# Patient Record
Sex: Male | Born: 1980 | Race: White | Hispanic: No | Marital: Married | Smoking: Current every day smoker
Health system: Southern US, Community
[De-identification: ages and names within clinical notes are randomized; demographics above are authoritative.]

## PROBLEM LIST (undated history)

## (undated) DIAGNOSIS — L409 Psoriasis, unspecified: Secondary | ICD-10-CM

## (undated) HISTORY — DX: Psoriasis, unspecified: L40.9

---

## 2011-05-05 ENCOUNTER — Emergency Department (HOSPITAL_COMMUNITY)
Admission: EM | Admit: 2011-05-05 | Discharge: 2011-05-05 | Disposition: A | Discharge status: Home / Self Care | Payer: Self-pay | Attending: Emergency Medicine | Admitting: Emergency Medicine

## 2011-05-05 DIAGNOSIS — W540XXA Bitten by dog, initial encounter: Secondary | ICD-10-CM | POA: Insufficient documentation

## 2011-05-05 DIAGNOSIS — S01501A Unspecified open wound of lip, initial encounter: Secondary | ICD-10-CM | POA: Insufficient documentation

## 2012-02-16 ENCOUNTER — Emergency Department (HOSPITAL_COMMUNITY)
Admission: EM | Admit: 2012-02-16 | Discharge: 2012-02-16 | Disposition: A | Discharge status: Home / Self Care | Payer: BC Managed Care – PPO | Attending: Emergency Medicine | Admitting: Emergency Medicine

## 2012-02-16 ENCOUNTER — Encounter (HOSPITAL_COMMUNITY): Payer: Self-pay | Admitting: *Deleted

## 2012-02-16 DIAGNOSIS — S70259A Superficial foreign body, unspecified hip, initial encounter: Secondary | ICD-10-CM | POA: Insufficient documentation

## 2012-02-16 DIAGNOSIS — W268XXA Contact with other sharp object(s), not elsewhere classified, initial encounter: Secondary | ICD-10-CM | POA: Insufficient documentation

## 2012-02-16 DIAGNOSIS — T148XXA Other injury of unspecified body region, initial encounter: Secondary | ICD-10-CM

## 2012-02-16 MED ORDER — SULFAMETHOXAZOLE-TRIMETHOPRIM 800-160 MG PO TABS: 1.0000 | ORAL_TABLET | Freq: Two times a day (BID) | ORAL | Status: AC

## 2012-02-16 NOTE — Discharge Instructions (Signed)
We were able to successfully remove the splinter. It does not appear that there are any residual pieces of the splinter left. Please keep the area clean and dry. Take the antibiotic as prescribed. Return to the ED with high fever, redness around the area, or any other worrisome symptoms.  RESOURCE GUIDE  Dental Problems  Patients with Medicaid: [facility] [phone] [address]                                           [phone] [address] Phone:  [phone]                                                  Phone:  [phone]  If unable to pay or uninsured, contact:  [facility] or [facility]. to become qualified for the adult dental clinic.  Chronic Pain Problems Contact [facility]  [phone] Patients need to be referred by their primary care doctor.  Insufficient Money for Medicine Contact United Way:  call "211" or [facility] [phone].  No Primary Care Doctor Call [facility]  [phone] Other agencies that provide inexpensive medical care    [facility]  [phone]    [facility]  [phone]    [facility]  [phone]    [facility]  [phone]    [facility]  [phone]    [facility]  [phone]  Psychological Services [facility]  [phone] [facility]  [phone] [facility]   [phone] (emergency services [phone])  Substance Abuse Resources [facility]  8022327933 [facility] [phone] [facility] [place] [phone] Floydene Flock [phone] Residential & Outpatient Substance Abuse Program  [phone]  Abuse/Neglect [facility] Child Abuse Hotline [phone] [facility] Child Abuse Hotline 6601396979 (After Hours)  Emergency Shelter [facility] [phone]  Maternity Homes [facility] [phone] [facility] [phone]  MRSA Hotline #:   204 754 1090    [facility] Resources  [facility]     [employer]                          [facility] Dept. [address] [place]                       [address]      [address]  [place]                                                Cristobal Goldmann Phone:  [phone]  Phone:  [phone]                 Phone:  [phone]  [facility] Phone:  [phone]  [facility] Child Abuse Hotline 8671627537 [phone] (After Hours)  Rosalio [place] splinters need to be removed because they can cause skin irritation and infection. If they are close to the surface, splinters can usually be removed easily. Deep splinters may be hard to locate and need treatment by a surgeon. SPLINTER REMOVAL Removal of splinters by your caregiver is considered a surgical procedure.   The area is carefully cleaned. You may require a small amount of anaesthesia (medicine injected near the splinter to numb the tissue and lessen pain). After the splinter is removed, the area will be cleaned again. A bandage is applied.   If your splinter is under a fingernail or toenail, then a small section of the nail may need to be removed. As long as the splinter did not extend to the base of the nail, the nail usually grows back normally.   A splinter that is deeper, more contaminated, or that gets near a structure such as a bone, nerve or blood vessel may need to be removed by a Careers adviser.   You may need special X-rays or scans if the splinter is hard to locate.   Every attempt is made to remove the entire splinter. However, small particles may remain. Tell your caregiver if you feel that a part of the splinter was left behind.  HOME CARE INSTRUCTIONS   Keep the injured area high up (elevated).   Use the  injured area as little as possible.   Keep the injured area clean and dry. Follow any directions from your caregiver.   Keep any follow-up or wound check appointments.  You might need a tetanus shot now if:  You have no idea when you had the last one.   You have never had a tetanus shot before.   The injured area had dirt in it.  Even if you have already removed the splinter, call your caregiver to get a tetanus shot if you need one.  If you need a tetanus shot, and you decide not to get one, there is a rare chance of getting tetanus. Sickness from tetanus can be serious. If you did get a tetanus shot, your arm may swell, get red and warm to the touch at the shot site. This is common and not a problem. SEEK MEDICAL CARE IF:   A splinter has been removed, but you are not better in a day or two.   You develop a temperature.   Signs of infection develop such as:   Redness, swelling or pus around the wound.   Red streaks spreading back from your wound towards your body.  Document Released: 11/03/2004 Document Revised: 09/15/2011 Document Reviewed: 10/06/2008 [facility] Patient Information 2012 [place], [place].

## 2012-02-16 NOTE — ED Provider Notes (Signed)
History     CSN: [id number]  Arrival date & time 02/16/12  4098   First MD Initiated Contact with Patient 02/16/12 2101      Chief Complaint  Patient presents with  . Leg Pain    (Consider location/radiation/quality/duration/timing/severity/associated sxs/prior treatment) HPI History from patient. 31 year old male who presents with splinter. He states that he was wearing shorts while sitting outside at a picnic table, and moved to the side. A large piece of wood became lodged in his posterior right upper leg. He was unable to remove it at home and presents to the ED for treatment. Tetanus is up-to-date. He has not noted any bleeding from the area.  History reviewed. No pertinent past medical history.  History reviewed. No pertinent past surgical history.  History reviewed. No pertinent family history.  History  Substance Use Topics  . Smoking status: Current Everyday Smoker    Types: Cigarettes  . Smokeless tobacco: Not on file  . Alcohol Use: Yes      Review of Systems  Constitutional: Negative.   Musculoskeletal: Negative for myalgias.  Skin: Positive for wound.    Allergies  Review of patient's allergies indicates no known allergies.  Home Medications   Current Outpatient Rx  Name Route Sig Dispense Refill  . IBUPROFEN 200 MG PO TABS Oral Take 200 mg by mouth every 6 (six) hours as needed. Pain      BP 135/94  Pulse 97  Temp(Src) 99.7 F (37.6 C) (Oral)  Resp 18  Ht 5\' 10"  (1.778 m)  Wt 230 lb (104.327 kg)  BMI 33.00 kg/m2  SpO2 100%  Physical Exam  Nursing note and vitals reviewed. Constitutional: He appears well-developed and well-nourished. No distress.  HENT:  Head: Normocephalic and atraumatic.  Neck: Normal range of motion.  Cardiovascular: Normal rate.   Pulmonary/Chest: Effort normal.  Musculoskeletal: Normal range of motion.  Neurological: He is alert.  Skin: Skin is warm and dry. He is not diaphoretic.       Large, approximately 0.5-1  cm in diameter, piece of wood noted to the posterior right upper leg. Approximately 1 cm of the wood is exposed. Foreign body is unable to be removed manually. No bleeding from the area.  Psychiatric: He has a normal mood and affect.    ED Course  FOREIGN BODY REMOVAL Performed by: Grant Fontana Authorized by: Grant Fontana Consent: Verbal consent obtained. Risks and benefits: risks, benefits and alternatives were discussed Consent given by: patient Body area: skin General location: lower extremity Location details: right upper leg Anesthesia: local infiltration Local anesthetic: lidocaine 2% with epinephrine Anesthetic total: 1.5 ml Patient sedated: no Patient cooperative: yes 1 objects recovered. Objects recovered: ~3.5 cm long wood splinter Post-procedure assessment: foreign body removed Patient tolerance: Patient tolerated the procedure well with no immediate complications.   (including critical care time)  Labs Reviewed - No data to display No results found.   1. Splinter in skin       MDM  Patient presents for removal of large splinter. Unable to remove with pickups or hemostats. After injecting the area with lidocaine, I made a small incision around the entry site with an 11 blade to facilitate removal. The splinter was able to be removed in its entirety with no remaining fragments seen. Patient tolerated this well. Minimal bleeding from the area which was easily controlled. Patient instructed on wound care. Return precautions discussed.        Grant Fontana, [place] 02/17/12 2050

## 2012-02-16 NOTE — ED Notes (Signed)
Pt states he sat down on picnic table and "scooted over" pt has small piece of wood protruding from right thigh. Skin otherwise intact. No bleeding noted. Reports last tetanus was in last year.

## 2012-02-19 NOTE — ED Provider Notes (Signed)
Medical screening examination/treatment/procedure(s) were performed by non-physician practitioner and as supervising physician I was immediately available for consultation/collaboration.  Cheri Guppy, MD 02/19/12 [phone]

## 2012-06-18 ENCOUNTER — Encounter: Payer: Self-pay | Admitting: Family Medicine

## 2012-06-18 ENCOUNTER — Ambulatory Visit (INDEPENDENT_AMBULATORY_CARE_PROVIDER_SITE_OTHER): Payer: BC Managed Care – PPO | Admitting: Family Medicine

## 2012-06-18 VITALS — BP 120/82 | HR 81 | Temp 98.5°F | Ht 70.5 in | Wt 251.0 lb

## 2012-06-18 DIAGNOSIS — L408 Other psoriasis: Secondary | ICD-10-CM

## 2012-06-18 DIAGNOSIS — G4700 Insomnia, unspecified: Secondary | ICD-10-CM

## 2012-06-18 DIAGNOSIS — Z720 Tobacco use: Secondary | ICD-10-CM

## 2012-06-18 DIAGNOSIS — L409 Psoriasis, unspecified: Secondary | ICD-10-CM

## 2012-06-18 DIAGNOSIS — G4730 Sleep apnea, unspecified: Secondary | ICD-10-CM

## 2012-06-18 DIAGNOSIS — F17200 Nicotine dependence, unspecified, uncomplicated: Secondary | ICD-10-CM

## 2012-06-18 MED ORDER — VARENICLINE TARTRATE 0.5 MG X 11 & 1 MG X 42 PO MISC: ORAL | Status: AC

## 2012-06-18 NOTE — Patient Instructions (Addendum)
Smoking Cessation This document explains the best ways for you to quit smoking and new treatments to help. It lists new medicines that can double or triple your chances of quitting and quitting for good. It also considers ways to avoid relapses and concerns you may have about quitting, including weight gain. NICOTINE: A POWERFUL ADDICTION If you have tried to quit smoking, you know how hard it can be. It is hard because nicotine is a very addictive drug. For some people, it can be as addictive as heroin or cocaine. Usually, people make 2 or 3 tries, or more, before finally being able to quit. Each time you try to quit, you can learn about what helps and what hurts. Quitting takes hard work and a lot of effort, but you can quit smoking. QUITTING SMOKING IS ONE OF THE MOST IMPORTANT THINGS YOU WILL EVER DO.  You will live longer, feel better, and live better.   The impact on your body of quitting smoking is felt almost immediately:   Within 20 minutes, blood pressure decreases. Pulse returns to its normal level.   After 8 hours, carbon monoxide levels in the blood return to normal. Oxygen level increases.   After 24 hours, chance of heart attack starts to decrease. Breath, hair, and body stop smelling like smoke.   After 48 hours, damaged nerve endings begin to recover. Sense of taste and smell improve.   After 72 hours, the body is virtually free of nicotine. Bronchial tubes relax and breathing becomes easier.   After 2 to 12 weeks, lungs can hold more air. Exercise becomes easier and circulation improves.   Quitting will reduce your risk of having a heart attack, stroke, cancer, or lung disease:   After 1 year, the risk of coronary heart disease is cut in half.   After 5 years, the risk of stroke falls to the same as a nonsmoker.   After 10 years, the risk of lung cancer is cut in half and the risk of other cancers decreases significantly.   After 15 years, the risk of coronary heart  disease drops, usually to the level of a nonsmoker.   If you are pregnant, quitting smoking will improve your chances of having a healthy baby.   The people you live with, especially your children, will be healthier.   You will have extra money to spend on things other than cigarettes.  FIVE KEYS TO QUITTING Studies have shown that these 5 steps will help you quit smoking and quit for good. You have the best chances of quitting if you use them together: 1. Get ready.  2. Get support and encouragement.  3. Learn new skills and behaviors.  4. Get medicine to reduce your nicotine addiction and use it correctly.  5. Be prepared for relapse or difficult situations. Be determined to continue trying to quit, even if you do not succeed at first.  1. GET READY  Set a quit date.   Change your environment.   Get rid of ALL cigarettes, ashtrays, matches, and lighters in your home, car, and place of work.   Do not let people smoke in your home.   Review your past attempts to quit. Think about what worked and what did not.   Once you quit, do not smoke. NOT EVEN A PUFF!  2. GET SUPPORT AND ENCOURAGEMENT Studies have shown that you have a better chance of being successful if you have help. You can get support in many ways.  Tell   your family, friends, and coworkers that you are going to quit and need their support. Ask them not to smoke around you.   Talk to your caregivers (doctor, dentist, nurse, pharmacist, psychologist, and/or smoking counselor).   Get individual, group, or telephone counseling and support. The more counseling you have, the better your chances are of quitting. Programs are available at local hospitals and health centers. Call your local health department for information about programs in your area.   Spiritual beliefs and practices may help some smokers quit.   Quit meters are small computer programs online or downloadable that keep track of quit statistics, such as amount  of "quit-time," cigarettes not smoked, and money saved.   Many smokers find one or more of the many self-help books available useful in helping them quit and stay off tobacco.  3. LEARN NEW SKILLS AND BEHAVIORS  Try to distract yourself from urges to smoke. Talk to someone, go for a walk, or occupy your time with a task.   When you first try to quit, change your routine. Take a different route to work. Drink tea instead of coffee. Eat breakfast in a different place.   Do something to reduce your stress. Take a hot bath, exercise, or read a book.   Plan something enjoyable to do every day. Reward yourself for not smoking.   Explore interactive web-based programs that specialize in helping you quit.  4. GET MEDICINE AND USE IT CORRECTLY Medicines can help you stop smoking and decrease the urge to smoke. Combining medicine with the above behavioral methods and support can quadruple your chances of successfully quitting smoking. The U.S. Food and Drug Administration (FDA) has approved 7 medicines to help you quit smoking. These medicines fall into 3 categories.  Nicotine replacement therapy (delivers nicotine to your body without the negative effects and risks of smoking):   Nicotine gum: Available over-the-counter.   Nicotine lozenges: Available over-the-counter.   Nicotine inhaler: Available by prescription.   Nicotine nasal spray: Available by prescription.   Nicotine skin patches (transdermal): Available by prescription and over-the-counter.   Antidepressant medicine (helps people abstain from smoking, but how this works is unknown):   Bupropion sustained-release (SR) tablets: Available by prescription.   Nicotinic receptor partial agonist (simulates the effect of nicotine in your brain):   Varenicline tartrate tablets: Available by prescription.   Ask your caregiver for advice about which medicines to use and how to use them. Carefully read the information on the package.    Everyone who is trying to quit may benefit from using a medicine. If you are pregnant or trying to become pregnant, nursing an infant, you are under age 18, or you smoke fewer than 10 cigarettes per day, talk to your caregiver before taking any nicotine replacement medicines.   You should stop using a nicotine replacement product and call your caregiver if you experience nausea, dizziness, weakness, vomiting, fast or irregular heartbeat, mouth problems with the lozenge or gum, or redness or swelling of the skin around the patch that does not go away.   Do not use any other product containing nicotine while using a nicotine replacement product.   Talk to your caregiver before using these products if you have diabetes, heart disease, asthma, stomach ulcers, you had a recent heart attack, you have high blood pressure that is not controlled with medicine, a history of irregular heartbeat, or you have been prescribed medicine to help you quit smoking.  5. BE PREPARED FOR RELAPSE OR   DIFFICULT SITUATIONS  Most relapses occur within the first 3 months after quitting. Do not be discouraged if you start smoking again. Remember, most people try several times before they finally quit.   You may have symptoms of withdrawal because your body is used to nicotine. You may crave cigarettes, be irritable, feel very hungry, cough often, get headaches, or have difficulty concentrating.   The withdrawal symptoms are only temporary. They are strongest when you first quit, but they will go away within 10 to 14 days.  Here are some difficult situations to watch for:  Alcohol. Avoid drinking alcohol. Drinking lowers your chances of successfully quitting.   Caffeine. Try to reduce the amount of caffeine you consume. It also lowers your chances of successfully quitting.   Other smokers. Being around smoking can make you want to smoke. Avoid smokers.   Weight gain. Many smokers will gain weight when they quit, usually  less than 10 pounds. Eat a healthy diet and stay active. Do not let weight gain distract you from your main goal, quitting smoking. Some medicines that help you quit smoking may also help delay weight gain. You can always lose the weight gained after you quit.   Bad mood or depression. There are a lot of ways to improve your mood other than smoking.  If you are having problems with any of these situations, talk to your caregiver. SPECIAL SITUATIONS AND CONDITIONS Studies suggest that everyone can quit smoking. Your situation or condition can give you a special reason to quit.  Pregnant women/new mothers: By quitting, you protect your baby's health and your own.   Hospitalized patients: By quitting, you reduce health problems and help healing.   Heart attack patients: By quitting, you reduce your risk of a second heart attack.   Lung, head, and neck cancer patients: By quitting, you reduce your chance of a second cancer.   Parents of children and adolescents: By quitting, you protect your children from illnesses caused by secondhand smoke.  QUESTIONS TO THINK ABOUT Think about the following questions before you try to stop smoking. You may want to talk about your answers with your caregiver.  Why do you want to quit?   If you tried to quit in the past, what helped and what did not?   What will be the most difficult situations for you after you quit? How will you plan to handle them?   Who can help you through the tough times? Your family? Friends? Caregiver?   What pleasures do you get from smoking? What ways can you still get pleasure if you quit?  Here are some questions to ask your caregiver:  How can you help me to be successful at quitting?   What medicine do you think would be best for me and how should I take it?   What should I do if I need more help?   What is smoking withdrawal like? How can I get information on withdrawal?  Quitting takes hard work and a lot of effort,  but you can quit smoking. FOR MORE INFORMATION  Smokefree.gov (http://www.davis-sullivan.com/) provides free, accurate, evidence-based information and professional assistance to help support the immediate and long-term needs of people trying to quit smoking. Document Released: 09/20/2001 Document Revised: 09/15/2011 Document Reviewed: 07/13/2009 [facility] Patient Information 2012 [place], [place].  Insomnia Insomnia is frequent trouble falling and/or staying asleep. Insomnia can be a long term problem or a short term problem. Both are common. Insomnia can be a short term  problem when the wakefulness is related to a certain stress or worry. Long term insomnia is often related to ongoing stress during waking hours and/or poor sleeping habits. Overtime, sleep deprivation itself can make the problem worse. Every little thing feels more severe because you are overtired and your ability to cope is decreased. CAUSES   Stress, anxiety, and depression.   Poor sleeping habits.   Distractions such as TV in the bedroom.   Naps close to bedtime.   Engaging in emotionally charged conversations before bed.   Technical reading before sleep.   Alcohol and other sedatives. They may make the problem worse. They can hurt normal sleep patterns and normal dream activity.   Stimulants such as caffeine for several hours prior to bedtime.   Pain syndromes and shortness of breath can cause insomnia.   Exercise late at night.   Changing time zones may cause sleeping problems (jet lag).  It is sometimes helpful to have someone observe your sleeping patterns. They should look for periods of not breathing during the night (sleep apnea). They should also look to see how long those periods last. If you live alone or observers are uncertain, you can also be observed at a sleep clinic where your sleep patterns will be professionally monitored. Sleep apnea requires a checkup and treatment. Give your caregivers your medical  history. Give your caregivers observations your family has made about your sleep.  SYMPTOMS   Not feeling rested in the morning.   Anxiety and restlessness at bedtime.   Difficulty falling and staying asleep.  TREATMENT   Your caregiver may prescribe treatment for an underlying medical disorders. Your caregiver can give advice or help if you are using alcohol or other drugs for self-medication. Treatment of underlying problems will usually eliminate insomnia problems.   Medications can be prescribed for short time use. They are generally not recommended for lengthy use.   Over-the-counter sleep medicines are not recommended for lengthy use. They can be habit forming.   You can promote easier sleeping by making lifestyle changes such as:   Using relaxation techniques that help with breathing and reduce muscle tension.   Exercising earlier in the day.   Changing your diet and the time of your last meal. No night time snacks.   Establish a regular time to go to bed.   Counseling can help with stressful problems and worry.   Soothing music and white noise may be helpful if there are background noises you cannot remove.   Stop tedious detailed work at least one hour before bedtime.  HOME CARE INSTRUCTIONS   Keep a diary. Inform your caregiver about your progress. This includes any medication side effects. See your caregiver regularly. Take note of:   Times when you are asleep.   Times when you are awake during the night.   The quality of your sleep.   How you feel the next day.  This information will help your caregiver care for you.  Get out of bed if you are still awake after 15 minutes. Read or do some quiet activity. Keep the lights down. Wait until you feel sleepy and go back to bed.   Keep regular sleeping and waking hours. Avoid naps.   Exercise regularly.   Avoid distractions at bedtime. Distractions include watching television or engaging in any intense or  detailed activity like attempting to balance the household checkbook.   Develop a bedtime ritual. Keep a familiar routine of bathing, brushing your teeth, climbing  into bed at the same time each night, listening to soothing music. Routines increase the success of falling to sleep faster.   Use relaxation techniques. This can be using breathing and muscle tension release routines. It can also include visualizing peaceful scenes. You can also help control troubling or intruding thoughts by keeping your mind occupied with boring or repetitive thoughts like the old concept of counting sheep. You can make it more creative like imagining planting one beautiful flower after another in your backyard garden.   During your day, work to eliminate stress. When this is not possible use some of the previous suggestions to help reduce the anxiety that accompanies stressful situations.  MAKE SURE YOU:   Understand these instructions.   Will watch your condition.   Will get help right away if you are not doing well or get worse.  Document Released: 09/23/2000 Document Revised: 09/15/2011 Document Reviewed: 10/24/2007 [facility] Patient Information 2012 [place], [place].

## 2012-06-19 DIAGNOSIS — G4700 Insomnia, unspecified: Secondary | ICD-10-CM | POA: Insufficient documentation

## 2012-06-19 DIAGNOSIS — L409 Psoriasis, unspecified: Secondary | ICD-10-CM | POA: Insufficient documentation

## 2012-06-19 DIAGNOSIS — Z720 Tobacco use: Secondary | ICD-10-CM | POA: Insufficient documentation

## 2012-06-19 NOTE — Assessment & Plan Note (Signed)
See med list Derm if no better

## 2012-06-19 NOTE — Progress Notes (Signed)
  Subjective:    Patient ID: Patrick Dixon, male    DOB: [birth year], 31 y.o.   MRN: [id number]  HPI Pt here to establish --and discuss rash on legs, he would like to quit smoking.   Review of Systems As above    Objective:   Physical Exam  Constitutional: He is oriented to person, place, and time. He appears well-developed and well-nourished.  Cardiovascular: Normal rate and regular rhythm.   Pulmonary/Chest: Effort normal and breath sounds normal.  Neurological: He is alert and oriented to person, place, and time.  Skin:       errythematous , scaley rash on Low ext  C/w with psoriasis  Psychiatric: He has a normal mood and affect. His behavior is normal. Judgment and thought content normal.          Assessment & Plan:

## 2012-06-19 NOTE — Assessment & Plan Note (Signed)
?   Sleep apnea Pt does snore Refer to pulm for sleep eval

## 2012-06-19 NOTE — Assessment & Plan Note (Signed)
chantix F/u 1 month

## 2012-06-26 ENCOUNTER — Encounter: Payer: Self-pay | Admitting: Pulmonary Disease

## 2012-06-26 ENCOUNTER — Ambulatory Visit (INDEPENDENT_AMBULATORY_CARE_PROVIDER_SITE_OTHER): Payer: BC Managed Care – PPO | Admitting: Pulmonary Disease

## 2012-06-26 VITALS — BP 130/82 | HR 100 | Temp 98.6°F | Ht 70.0 in | Wt 252.4 lb

## 2012-06-26 DIAGNOSIS — G4733 Obstructive sleep apnea (adult) (pediatric): Secondary | ICD-10-CM | POA: Insufficient documentation

## 2012-06-26 DIAGNOSIS — Z23 Encounter for immunization: Secondary | ICD-10-CM

## 2012-06-26 NOTE — Patient Instructions (Addendum)
Will set up for home sleep testing.  Will call you once results are available.  Work on weight loss.

## 2012-06-26 NOTE — Assessment & Plan Note (Signed)
The patient's history is very suggestive of clinically significant sleep apnea.  He has been noted to have loud snoring, witnessed apnea during sleep, and has significant nonrestorative sleep.  He is overweight with an abnormal upper airway anatomy.  At this point, I think he needs sleep testing for evaluation, and the patient is agreeable.  The patient has a history suspicious for sleep apnea, has no underlying health issues, and will be a good candidate for home sleep testing.

## 2012-06-26 NOTE — Progress Notes (Signed)
  Subjective:    Patient ID: Patrick Dixon, male    DOB: September 24, 1981, 31 y.o.   MRN: [id number]  HPI The patient is a 31 year old male who had been asked to see for possible obstructive sleep apnea.  He is been noted to have loud snoring, as well as witnessed apneas by bed partners during sleep.  The patient has frequent awakenings at night, and has not rested upon arising.  He feels that his alertness during his waking hours is adequate, but stays very busy in his job.  He does note sleep pressure if he sits down to watch TV or movies.  He denies any sleepiness with driving.  The patient states that his weight is up 15 pounds over the last 2 years, and his Epworth sleepiness score today is 4.  Sleep Questionnaire: What time do you typically go to bed?( Between what hours) 4am-5am .pt bar tends How long does it take you to fall asleep? 20 mins .sometimes longer How many times during the night do you wake up? 5 What time do you get out of bed to start your day? 1200 Do you drive or operate heavy machinery in your occupation? No How much has your weight changed (up or down) over the past two years? (In pounds) 15 lb (6.804 kg) Have you ever had a sleep study before? No Do you currently use CPAP? No Do you wear oxygen at any time? No    Review of Systems  Constitutional: Negative for fever and unexpected weight change.  HENT: Positive for rhinorrhea. Negative for ear pain, nosebleeds, congestion, sore throat, sneezing, trouble swallowing, dental problem, postnasal drip and sinus pressure.   Eyes: Negative for redness and itching.  Respiratory: Positive for choking. Negative for cough, chest tightness, shortness of breath and wheezing.   Cardiovascular: Negative for palpitations and leg swelling.  Gastrointestinal: Negative for nausea and vomiting.  Genitourinary: Negative for dysuria.  Musculoskeletal: Negative for joint swelling.  Skin: Positive for rash.  Neurological: Negative for headaches.    Hematological: Does not bruise/bleed easily.  Psychiatric/Behavioral: Negative for dysphoric mood. The patient is nervous/anxious.        Objective:   Physical Exam Constitutional:  Overweight male, no acute distress  HENT:  Nares patent without discharge, large turbinates noted.   Oropharynx without exudate, palate and uvula are thickened and elongated.   Eyes:  Perrla, eomi, no scleral icterus  Neck:  No JVD, no TMG  Cardiovascular:  Normal rate, regular rhythm, no rubs or gallops.  No murmurs        Intact distal pulses  Pulmonary :  Normal breath sounds, no stridor or respiratory distress   No rales, rhonchi, or wheezing  Abdominal:  Soft, nondistended, bowel sounds present.  No tenderness noted.   Musculoskeletal:  No lower extremity edema noted.  Lymph Nodes:  No cervical lymphadenopathy noted  Skin:  No cyanosis noted  Neurologic:  Appears sleepy, appropriate, moves all 4 extremities without obvious deficit.         Assessment & Plan:

## 2012-08-17 ENCOUNTER — Encounter: Payer: Self-pay | Admitting: Family Medicine

## 2012-08-17 ENCOUNTER — Ambulatory Visit (INDEPENDENT_AMBULATORY_CARE_PROVIDER_SITE_OTHER): Payer: BC Managed Care – PPO | Admitting: Family Medicine

## 2012-08-17 VITALS — BP 120/82 | HR 84 | Temp 98.2°F | Ht 70.5 in | Wt 254.8 lb

## 2012-08-17 DIAGNOSIS — L409 Psoriasis, unspecified: Secondary | ICD-10-CM

## 2012-08-17 DIAGNOSIS — G4733 Obstructive sleep apnea (adult) (pediatric): Secondary | ICD-10-CM

## 2012-08-17 DIAGNOSIS — Z0000 Encounter for general adult medical examination without abnormal findings: Secondary | ICD-10-CM

## 2012-08-17 DIAGNOSIS — Z720 Tobacco use: Secondary | ICD-10-CM

## 2012-08-17 DIAGNOSIS — Z23 Encounter for immunization: Secondary | ICD-10-CM

## 2012-08-17 LAB — CBC WITH DIFFERENTIAL/PLATELET
Basophils Absolute: 0 10*3/uL (ref 0.0–0.1)
Eosinophils Absolute: 0.3 10*3/uL (ref 0.0–0.7)
Eosinophils Relative: 2.6 % (ref 0.0–5.0)
HCT: 45 % (ref 39.0–52.0)
Lymphs Abs: 2.2 10*3/uL (ref 0.7–4.0)
MCHC: 33.7 g/dL (ref 30.0–36.0)
MCV: 92.8 fl (ref 78.0–100.0)
Monocytes Absolute: 0.5 10*3/uL (ref 0.1–1.0)
Neutrophils Relative %: 69.8 % (ref 43.0–77.0)
Platelets: 264 10*3/uL (ref 150.0–400.0)
RDW: 13.8 % (ref 11.5–14.6)
WBC: 10.1 10*3/uL (ref 4.5–10.5)

## 2012-08-17 LAB — LIPID PANEL
Cholesterol: 205 mg/dL — ABNORMAL HIGH (ref 0–200)
HDL: 33 mg/dL — ABNORMAL LOW (ref >39.00)
Total CHOL/HDL Ratio: 6
Triglycerides: 211 mg/dL — ABNORMAL HIGH (ref 0.0–149.0)
VLDL: 42.2 mg/dL — ABNORMAL HIGH (ref 0.0–40.0)

## 2012-08-17 LAB — BASIC METABOLIC PANEL
BUN: 12 mg/dL (ref 6–23)
Chloride: 106 mEq/L (ref 96–112)
Creatinine, Ser: 0.9 mg/dL (ref 0.4–1.5)
Glucose, Bld: 96 mg/dL (ref 70–99)
Potassium: 4.2 mEq/L (ref 3.5–5.1)

## 2012-08-17 LAB — POCT URINALYSIS DIPSTICK
Bilirubin, UA: NEGATIVE
Blood, UA: NEGATIVE
Leukocytes, UA: NEGATIVE
Nitrite, UA: NEGATIVE
Protein, UA: NEGATIVE
pH, UA: 5

## 2012-08-17 LAB — HEPATIC FUNCTION PANEL
ALT: 29 U/L (ref 0–53)
Bilirubin, Direct: 0.1 mg/dL (ref 0.0–0.3)
Total Bilirubin: 0.3 mg/dL (ref 0.3–1.2)

## 2012-08-17 NOTE — Assessment & Plan Note (Signed)
Much better with creams from derm

## 2012-08-17 NOTE — Assessment & Plan Note (Signed)
Per pulm 

## 2012-08-17 NOTE — Assessment & Plan Note (Signed)
Encouraged pt to quit  He never filled chantix rx

## 2012-08-17 NOTE — Patient Instructions (Addendum)
Preventive Care for Adults, Male A healthy lifestyle and preventive care can promote health and wellness. Preventive health guidelines for men include the following key practices:  A routine yearly physical is a good way to check with your caregiver about your health and preventative screening. It is a chance to share any concerns and updates on your health, and to receive a thorough exam.  Visit your dentist for a routine exam and preventative care every 6 months. Brush your teeth twice a day and floss once a day. Good oral hygiene prevents tooth decay and gum disease.  The frequency of eye exams is based on your age, health, family medical history, use of contact lenses, and other factors. Follow your caregiver's recommendations for frequency of eye exams.  Eat a healthy diet. Foods like vegetables, fruits, whole grains, low-fat dairy products, and lean protein foods contain the nutrients you need without too many calories. Decrease your intake of foods high in solid fats, added sugars, and salt. Eat the right amount of calories for you.Get information about a proper diet from your caregiver, if necessary.  Regular physical exercise is one of the most important things you can do for your health. Most adults should get at least 150 minutes of moderate-intensity exercise (any activity that increases your heart rate and causes you to sweat) each week. In addition, most adults need muscle-strengthening exercises on 2 or more days a week.  Maintain a healthy weight. The body mass index (BMI) is a screening tool to identify possible weight problems. It provides an estimate of body fat based on height and weight. Your caregiver can help determine your BMI, and can help you achieve or maintain a healthy weight.For adults 20 years and older:  A BMI below 18.5 is considered underweight.  A BMI of 18.5 to 24.9 is normal.  A BMI of 25 to 29.9 is considered overweight.  A BMI of 30 and above is  considered obese.  Maintain normal blood lipids and cholesterol levels by exercising and minimizing your intake of saturated fat. Eat a balanced diet with plenty of fruit and vegetables. Blood tests for lipids and cholesterol should begin at age 20 and be repeated every 5 years. If your lipid or cholesterol levels are high, you are over 50, or you are a high risk for heart disease, you may need your cholesterol levels checked more frequently.Ongoing high lipid and cholesterol levels should be treated with medicines if diet and exercise are not effective.  If you smoke, find out from your caregiver how to quit. If you do not use tobacco, do not start.  If you choose to drink alcohol, do not exceed 2 drinks per day. One drink is considered to be 12 ounces (355 mL) of beer, 5 ounces (148 mL) of wine, or 1.5 ounces (44 mL) of liquor.  Avoid use of street drugs. Do not share needles with anyone. Ask for help if you need support or instructions about stopping the use of drugs.  High blood pressure causes heart disease and increases the risk of stroke. Your blood pressure should be checked at least every 1 to 2 years. Ongoing high blood pressure should be treated with medicines, if weight loss and exercise are not effective.  If you are 45 to 32 years old, ask your caregiver if you should take aspirin to prevent heart disease.  Diabetes screening involves taking a blood sample to check your fasting blood sugar level. This should be done once every 3 years,   after age 45, if you are within normal weight and without risk factors for diabetes. Testing should be considered at a younger age or be carried out more frequently if you are overweight and have at least 1 risk factor for diabetes.  Colorectal cancer can be detected and often prevented. Most routine colorectal cancer screening begins at the age of 50 and continues through age 75. However, your caregiver may recommend screening at an earlier age if you  have risk factors for colon cancer. On a yearly basis, your caregiver may provide home test kits to check for hidden blood in the stool. Use of a small camera at the end of a tube, to directly examine the colon (sigmoidoscopy or colonoscopy), can detect the earliest forms of colorectal cancer. Talk to your caregiver about this at age 50, when routine screening begins. Direct examination of the colon should be repeated every 5 to 10 years through age 75, unless early forms of pre-cancerous polyps or small growths are found.  Hepatitis C blood testing is recommended for all people born from 1945 through 1965 and any individual with known risks for hepatitis C.  Practice safe sex. Use condoms and avoid high-risk sexual practices to reduce the spread of sexually transmitted infections (STIs). STIs include gonorrhea, chlamydia, syphilis, trichomonas, herpes, HPV, and human immunodeficiency virus (HIV). Herpes, HIV, and HPV are viral illnesses that have no cure. They can result in disability, cancer, and death.  A one-time screening for abdominal aortic aneurysm (AAA) and surgical repair of large AAAs by sound wave imaging (ultrasonography) is recommended for ages 65 to 75 years who are current or former smokers.  Healthy men should no longer receive prostate-specific antigen (PSA) blood tests as part of routine cancer screening. Consult with your caregiver about prostate cancer screening.  Testicular cancer screening is not recommended for adult males who have no symptoms. Screening includes self-exam, caregiver exam, and other screening tests. Consult with your caregiver about any symptoms you have or any concerns you have about testicular cancer.  Use sunscreen with skin protection factor (SPF) of 30 or more. Apply sunscreen liberally and repeatedly throughout the day. You should seek shade when your shadow is shorter than you. Protect yourself by wearing long sleeves, pants, a wide-brimmed hat, and  sunglasses year round, whenever you are outdoors.  Once a month, do a whole body skin exam, using a mirror to look at the skin on your back. Notify your caregiver of new moles, moles that have irregular borders, moles that are larger than a pencil eraser, or moles that have changed in shape or color.  Stay current with required immunizations.  Influenza. You need a dose every fall (or winter). The composition of the flu vaccine changes each year, so being vaccinated once is not enough.  Pneumococcal polysaccharide. You need 1 to 2 doses if you smoke cigarettes or if you have certain chronic medical conditions. You need 1 dose at age 65 (or older) if you have never been vaccinated.  Tetanus, diphtheria, pertussis (Tdap, Td). Get 1 dose of Tdap vaccine if you are younger than age 65 years, are over 65 and have contact with an infant, are a healthcare worker, or simply want to be protected from whooping cough. After that, you need a Td booster dose every 10 years. Consult your caregiver if you have not had at least 3 tetanus and diphtheria-containing shots sometime in your life or have a deep or dirty wound.  HPV. This vaccine is recommended   for males 13 through 32 years of age. This vaccine may be given to men 22 through 32 years of age who have not completed the 3 dose series. It is recommended for men through age 26 who have sex with men or whose immune system is weakened because of HIV infection, other illness, or medications. The vaccine is given in 3 doses over 6 months.  Measles, mumps, rubella (MMR). You need at least 1 dose of MMR if you were born in 1957 or later. You may also need a 2nd dose.  Meningococcal. If you are age 19 to 21 years and a first-year college student living in a residence hall, or have one of several medical conditions, you need to get vaccinated against meningococcal disease. You may also need additional booster doses.  Zoster (shingles). If you are age 60 years or  older, you should get this vaccine.  Varicella (chickenpox). If you have never had chickenpox or you were vaccinated but received only 1 dose, talk to your caregiver to find out if you need this vaccine.  Hepatitis A. You need this vaccine if you have a specific risk factor for hepatitis A virus infection, or you simply wish to be protected from this disease. The vaccine is usually given as 2 doses, 6 to 18 months apart.  Hepatitis B. You need this vaccine if you have a specific risk factor for hepatitis B virus infection or you simply wish to be protected from this disease. The vaccine is given in 3 doses, usually over 6 months. Preventative Service / Frequency Ages 19 to 39  Blood pressure check.** / Every 1 to 2 years.  Lipid and cholesterol check.** / Every 5 years beginning at age 20.  Hepatitis C blood test.** / For any individual with known risks for hepatitis C.  Skin self-exam. / Monthly.  Influenza immunization.** / Every year.  Pneumococcal polysaccharide immunization.** / 1 to 2 doses if you smoke cigarettes or if you have certain chronic medical conditions.  Tetanus, diphtheria, pertussis (Tdap,Td) immunization. / A one-time dose of Tdap vaccine. After that, you need a Td booster dose every 10 years.  HPV immunization. / 3 doses over 6 months, if 26 and younger.  Measles, mumps, rubella (MMR) immunization. / You need at least 1 dose of MMR if you were born in 1957 or later. You may also need a 2nd dose.  Meningococcal immunization. / 1 dose if you are age 19 to 21 years and a first-year college student living in a residence hall, or have one of several medical conditions, you need to get vaccinated against meningococcal disease. You may also need additional booster doses.  Varicella immunization.** / Consult your caregiver.  Hepatitis A immunization.** / Consult your caregiver. 2 doses, 6 to 18 months apart.  Hepatitis B immunization.** / Consult your caregiver. 3 doses  usually over 6 months. Ages 40 to 64  Blood pressure check.** / Every 1 to 2 years.  Lipid and cholesterol check.** / Every 5 years beginning at age 20.  Fecal occult blood test (FOBT) of stool. / Every year beginning at age 50 and continuing until age 75. You may not have to do this test if you get colonoscopy every 10 years.  Flexible sigmoidoscopy** or colonoscopy.** / Every 5 years for a flexible sigmoidoscopy or every 10 years for a colonoscopy beginning at age 50 and continuing until age 75.  Hepatitis C blood test.** / For all people born from 1945 through 1965 and any   individual with known risks for hepatitis C.  Skin self-exam. / Monthly.  Influenza immunization.** / Every year.  Pneumococcal polysaccharide immunization.** / 1 to 2 doses if you smoke cigarettes or if you have certain chronic medical conditions.  Tetanus, diphtheria, pertussis (Tdap/Td) immunization.** / A one-time dose of Tdap vaccine. After that, you need a Td booster dose every 10 years.  Measles, mumps, rubella (MMR) immunization. / You need at least 1 dose of MMR if you were born in 1957 or later. You may also need a 2nd dose.  Varicella immunization.**/ Consult your caregiver.  Meningococcal immunization.** / Consult your caregiver.  Hepatitis A immunization.** / Consult your caregiver. 2 doses, 6 to 18 months apart.  Hepatitis B immunization.** / Consult your caregiver. 3 doses, usually over 6 months. Ages 65 and over  Blood pressure check.** / Every 1 to 2 years.  Lipid and cholesterol check.**/ Every 5 years beginning at age 20.  Fecal occult blood test (FOBT) of stool. / Every year beginning at age 50 and continuing until age 75. You may not have to do this test if you get colonoscopy every 10 years.  Flexible sigmoidoscopy** or colonoscopy.** / Every 5 years for a flexible sigmoidoscopy or every 10 years for a colonoscopy beginning at age 50 and continuing until age 75.  Hepatitis C blood  test.** / For all people born from 1945 through 1965 and any individual with known risks for hepatitis C.  Abdominal aortic aneurysm (AAA) screening.** / A one-time screening for ages 65 to 75 years who are current or former smokers.  Skin self-exam. / Monthly.  Influenza immunization.** / Every year.  Pneumococcal polysaccharide immunization.** / 1 dose at age 65 (or older) if you have never been vaccinated.  Tetanus, diphtheria, pertussis (Tdap, Td) immunization. / A one-time dose of Tdap vaccine if you are over 65 and have contact with an infant, are a healthcare worker, or simply want to be protected from whooping cough. After that, you need a Td booster dose every 10 years.  Varicella immunization. ** / Consult your caregiver.  Meningococcal immunization.** / Consult your caregiver.  Hepatitis A immunization. ** / Consult your caregiver. 2 doses, 6 to 18 months apart.  Hepatitis B immunization.** / Check with your caregiver. 3 doses, usually over 6 months. **Family history and personal history of risk and conditions may change your caregiver's recommendations. Document Released: 11/22/2001 Document Revised: 12/19/2011 Document Reviewed: 02/21/2011 [employer] Patient Information 2013 [employer].  

## 2012-08-17 NOTE — Progress Notes (Signed)
Subjective:    Patient ID: Patrick Dixon, male    DOB: Jun 20, 1981, 31 y.o.   MRN: [id number]  HPI Pt here for cpe and labs. Pt saw derm and psoriasis is much better.   Pt also saw pulmonary and home sleep study was done--pt waiting to hear from them. No new complaints.     Review of Systems Review of Systems  Constitutional: Negative for activity change, appetite change and fatigue.  HENT: Negative for hearing loss, congestion, tinnitus and ear discharge.  dentist--due Eyes: Negative for visual disturbance (see optho-due)  Respiratory: Negative for cough, chest tightness and shortness of breath.   Cardiovascular: Negative for chest pain, palpitations and leg swelling.  Gastrointestinal: Negative for abdominal pain, diarrhea, constipation and abdominal distention.  Genitourinary: Negative for urgency, frequency, decreased urine volume and difficulty urinating.  Musculoskeletal: Negative for back pain, arthralgias and gait problem.  Skin: Negative for color change, pallor and rash.  Neurological: Negative for dizziness, light-headedness, numbness and headaches.  Hematological: Negative for adenopathy. Does not bruise/bleed easily.  Psychiatric/Behavioral: Negative for suicidal ideas, confusion, sleep disturbance, self-injury, dysphoric mood, decreased concentration and agitation.   Past Medical History  Diagnosis Date  . Psoriasis    History   Social History  . Marital Status: Married    Spouse Name: N/A    Number of Children: N/A  . Years of Education: N/A   Occupational History  . [employer]     Social History Main Topics  . Smoking status: Current Every Day Smoker -- 1.5 packs/day for 4 years    Types: Cigarettes  . Smokeless tobacco: Never Used  . Alcohol Use: 6.0 oz/week    10 Cans of beer per week  . Drug Use: No  . Sexually Active: Not on file   Other Topics Concern  . Not on file   Social History Narrative  . No narrative on file   Family  History  Problem Relation Age of Onset  . Diabetes Father   . Heart attack Maternal Grandfather   . Heart disease Maternal Grandfather 33    MI   Current Outpatient Prescriptions on File Prior to Visit  Medication Sig Dispense Refill  . varenicline (CHANTIX STARTING MONTH PAK) 0.5 MG X 11 & 1 MG X 42 tablet Take one 0.5 mg tablet by mouth once daily for 3 days, then increase to one 0.5 mg tablet twice daily for 4 days, then increase to one 1 mg tablet twice daily.  53 tablet  0         Objective:   Physical Exam  BP 120/82  Pulse 84  Temp 98.2 F (36.8 C) (Oral)  Ht 5' 10.5" (1.791 m)  Wt 254 lb 12.8 oz (115.577 kg)  BMI 36.04 kg/m2  SpO2 98% General appearance: alert, cooperative, appears stated age and no distress Head: Normocephalic, without obvious abnormality, atraumatic Eyes: conjunctivae/corneas clear. PERRL, EOM's intact. Fundi benign. Ears: normal TM's and external ear canals both ears Nose: Nares normal. Septum midline. Mucosa normal. No drainage or sinus tenderness. Throat: lips, mucosa, and tongue normal; teeth and gums normal Neck: no adenopathy, no carotid bruit, no JVD, supple, symmetrical, trachea midline and thyroid not enlarged, symmetric, no tenderness/mass/nodules Back: symmetric, no curvature. ROM normal. No CVA tenderness. Lungs: clear to auscultation bilaterally Chest wall: no tenderness Heart: regular rate and rhythm, S1, S2 normal, no murmur, click, rub or gallop Abdomen: soft, non-tender; bowel sounds normal; no masses,  no organomegaly Male genitalia: normal, penis:  no lesions or discharge. testes: no masses or tenderness. no hernias Rectal: deferred Extremities: extremities normal, atraumatic, no cyanosis or edema Pulses: 2+ and symmetric Skin: Skin color, texture, turgor normal. No rashes or lesions Lymph nodes: Cervical, supraclavicular, and axillary nodes normal. Neurologic: Alert and oriented X 3, normal strength and tone. Normal symmetric  reflexes. Normal coordination and gait Psych--- no anxiety or depression      Assessment & Plan:

## 2012-08-22 ENCOUNTER — Telehealth: Payer: Self-pay | Admitting: Pulmonary Disease

## 2012-08-22 DIAGNOSIS — G4733 Obstructive sleep apnea (adult) (pediatric): Secondary | ICD-10-CM

## 2012-08-22 NOTE — Telephone Encounter (Signed)
KC, please advise what type of sleep study you would like to order on patient. Thanks.

## 2012-08-23 NOTE — Telephone Encounter (Signed)
[facility] scheduled with Patrick Dixon for Monday 09/17/12 pt will arrive to sleep lab after he gets off work at 4 am. Jeanene Erb and spoke with patient and he is aware of appointment. Paperwork has been mailed to him. Spoke with Camelia Eng and she placed notes in computer, so Patrick Dixon will know what time patient is due to come in to the lab for study. Rhonda J Cobb

## 2012-08-23 NOTE — Telephone Encounter (Signed)
Order has been sent to [facility].  

## 2012-08-23 NOTE — Telephone Encounter (Signed)
Can send order for sle3

## 2012-09-17 ENCOUNTER — Ambulatory Visit (HOSPITAL_BASED_OUTPATIENT_CLINIC_OR_DEPARTMENT_OTHER): Payer: BC Managed Care – PPO | Attending: Pulmonary Disease

## 2012-09-17 DIAGNOSIS — G4733 Obstructive sleep apnea (adult) (pediatric): Secondary | ICD-10-CM | POA: Insufficient documentation

## 2012-09-26 ENCOUNTER — Telehealth: Payer: Self-pay | Admitting: Pulmonary Disease

## 2012-09-26 DIAGNOSIS — G4730 Sleep apnea, unspecified: Secondary | ICD-10-CM

## 2012-09-26 DIAGNOSIS — G4710 Hypersomnia, unspecified: Secondary | ICD-10-CM

## 2012-09-26 NOTE — Procedures (Signed)
NAME:  Patrick Dixon, Patrick Dixon            ACCOUNT NO.:  [id number]  MEDICAL RECORD NO.:  [id number]          PATIENT TYPE:  OUT  LOCATION:  SLEEP CENTER                 FACILITY:  [facility]  PHYSICIAN:  Barbaraann Share, MD,FCCPDATE OF BIRTH:  1980-02-19  DATE OF STUDY:  09/17/2012                           NOCTURNAL POLYSOMNOGRAM  REFERRING PHYSICIAN:  Barbaraann Share, MD,FCCP  REFERRING PHYSICIAN:  Barbaraann Share, MD,FCCP  INDICATION FOR STUDY:  Hypersomnia with sleep apnea.  EPWORTH SLEEPINESS SCORE:  5.  SLEEP ARCHITECTURE:  The patient had a total sleep time of 289 minutes with decreased slow-wave sleep for age and only 24 minutes of REM. Sleep onset latency was normal at 13 minutes, and REM onset was delayed at 172 minutes.  Sleep efficiency was moderately reduced at 83%.  RESPIRATORY DATA:  The patient was found to have 6 obstructive apneas and 92 obstructive hypopneas, giving him an AHI of 20 events per hour. The events occurred in all body positions, and there was very loud snoring noted throughout.  OXYGEN DATA:  There was O2 desaturation as low as 86% with the patient's obstructive events.  CARDIAC DATA:  No clinically significant arrhythmias were noted.  MOVEMENT/PARASOMNIA:  The patient had no significant leg jerks or other abnormal behavior seen.  IMPRESSION/RECOMMENDATION:  Moderate obstructive sleep apnea/hypopnea syndrome, with an AHI of 20 events per hour and oxygen desaturation as low as 86%.  Treatment for this degree of sleep apnea can include a trial of weight loss alone, upper airway surgery, dental appliance, and also CPAP.  Clinical correlation is suggested.     Barbaraann Share, MD,FCCP Diplomate, American Board of Sleep Medicine    [facility]MED[facility]  D:  09/26/2012 08:32:11  T:  09/26/2012 21:09:37  Job:  [id number]

## 2012-09-26 NOTE — Telephone Encounter (Signed)
Needs OV with KC to discuss sleep results. 

## 2012-09-26 NOTE — Telephone Encounter (Signed)
LMTCB x 1 

## 2012-10-01 NOTE — Telephone Encounter (Signed)
Spoke with pt and have scheduled rov with KC for tomorrow am at 11:30

## 2012-10-01 NOTE — Telephone Encounter (Signed)
Pt returned call. Patrick Dixon °

## 2012-10-02 ENCOUNTER — Ambulatory Visit (INDEPENDENT_AMBULATORY_CARE_PROVIDER_SITE_OTHER): Payer: BC Managed Care – PPO | Admitting: Pulmonary Disease

## 2012-10-02 ENCOUNTER — Encounter: Payer: Self-pay | Admitting: Pulmonary Disease

## 2012-10-02 VITALS — BP 140/100 | HR 97 | Temp 98.0°F | Ht 70.0 in | Wt 257.0 lb

## 2012-10-02 DIAGNOSIS — G4733 Obstructive sleep apnea (adult) (pediatric): Secondary | ICD-10-CM

## 2012-10-02 NOTE — Patient Instructions (Addendum)
Work on weight loss.  Let me know if not successful.  Consider treatment with cpap or dental appliance while working on weight loss.  Please call if you wish to consider.

## 2012-10-02 NOTE — Progress Notes (Signed)
  Subjective:    Patient ID: Patrick Dixon, male    DOB: [birth year], 31 y.o.   MRN: [id number]  HPI Patient comes in today for followup after his recent sleep study.  He was found to have moderate obstructive sleep apnea, with an AHI of 20 events per hour.  I have reviewed the study with him in detail, and answered all of his questions.   Review of Systems  Constitutional: Negative for fever and unexpected weight change.  HENT: Negative for ear pain, nosebleeds, congestion, sore throat, rhinorrhea, sneezing, trouble swallowing, dental problem, postnasal drip and sinus pressure.   Eyes: Negative for redness and itching.  Respiratory: Negative for cough, chest tightness, shortness of breath and wheezing.   Cardiovascular: Negative for palpitations and leg swelling.  Gastrointestinal: Negative for nausea and vomiting.  Genitourinary: Negative for dysuria.  Musculoskeletal: Negative for joint swelling.  Skin: Positive for rash.  Neurological: Negative for headaches.  Hematological: Does not bruise/bleed easily.  Psychiatric/Behavioral: Negative for dysphoric mood. The patient is nervous/anxious.        Objective:   Physical Exam Overweight male in no acute distress Nose without purulence or discharge noted Neck without lymphadenopathy or thyromegaly Lower extremities without edema, cyanosis Awake, but does appear sleepy, moves all 4 extremities.       Assessment & Plan:

## 2012-10-02 NOTE — Assessment & Plan Note (Signed)
The pt has moderate osa, and is symptomatic at this time.  I have discussed with him the various treatments, including weight loss trial, surgery, dental appliance, and cpap.  I have recommended a trial of cpap while working on weight loss, but the pt would like to take some tiime to try weight loss alone.  He will call me if he is not having success.

## 2014-04-22 ENCOUNTER — Encounter: Payer: Self-pay | Admitting: Family Medicine

## 2014-04-22 ENCOUNTER — Ambulatory Visit (INDEPENDENT_AMBULATORY_CARE_PROVIDER_SITE_OTHER): Payer: BC Managed Care – PPO | Admitting: Family Medicine

## 2014-04-22 VITALS — BP 130/74 | HR 108 | Temp 98.1°F | Wt 271.0 lb

## 2014-04-22 DIAGNOSIS — S93409A Sprain of unspecified ligament of unspecified ankle, initial encounter: Secondary | ICD-10-CM

## 2014-04-22 DIAGNOSIS — S93402A Sprain of unspecified ligament of left ankle, initial encounter: Secondary | ICD-10-CM

## 2014-04-22 NOTE — Progress Notes (Signed)
Pre visit review using our clinic review tool, if applicable. No additional management support is needed unless otherwise documented below in the visit note. 

## 2014-04-22 NOTE — Progress Notes (Signed)
  Subjective:    Patrick Dixon is a 33 y.o. male who presents with left ankle pain. Onset of the symptoms was several days ago. Inciting event: playing vollyball in pool. Current symptoms include: ability to bear weight, but with some pain. Aggravating factors: standing and walking . Symptoms have stabilized. Patient has had no prior ankle problems. Evaluation to date: none. Treatment to date: none. The following portions of the patient's history were reviewed and updated as appropriate: allergies, current medications, past family history, past medical history, past social history, past surgical history and problem list.    Objective:    BP 130/74  Pulse 108  Temp(Src) 98.1 F (36.7 C) (Oral)  Wt 271 lb (122.925 kg)  SpO2 96% Right ankle:   normal  Left ankle:   no bruising+ tenderness over the lateral malleolus   Imaging: X-ray of the left ankle(s): ordered, but results not yet available    Assessment:    Ankle sprain    Plan:    Natural history and expected course discussed. Questions answered. Rest, ice, compression, elevation (RICE) therapy. Transport plannerducational materials distributed. Fit with ankle brace for use over next 2 weeks. NSAIDs per medication orders. ortho prn

## 2014-04-22 NOTE — Patient Instructions (Signed)

## 2014-04-23 ENCOUNTER — Ambulatory Visit (INDEPENDENT_AMBULATORY_CARE_PROVIDER_SITE_OTHER)
Admission: RE | Admit: 2014-04-23 | Discharge: 2014-04-23 | Disposition: A | Discharge status: Home / Self Care | Payer: BC Managed Care – PPO | Source: Ambulatory Visit | Attending: Family Medicine | Admitting: Family Medicine

## 2014-04-23 DIAGNOSIS — S93402A Sprain of unspecified ligament of left ankle, initial encounter: Secondary | ICD-10-CM

## 2014-04-23 DIAGNOSIS — S93409A Sprain of unspecified ligament of unspecified ankle, initial encounter: Secondary | ICD-10-CM

## 2014-05-05 ENCOUNTER — Encounter: Payer: Self-pay | Admitting: Physician Assistant

## 2014-05-05 ENCOUNTER — Ambulatory Visit (INDEPENDENT_AMBULATORY_CARE_PROVIDER_SITE_OTHER): Payer: BC Managed Care – PPO | Admitting: Physician Assistant

## 2014-05-05 VITALS — BP 131/84 | HR 108 | Temp 98.7°F | Resp 18 | Ht 70.0 in | Wt 270.5 lb

## 2014-05-05 DIAGNOSIS — Z720 Tobacco use: Secondary | ICD-10-CM

## 2014-05-05 DIAGNOSIS — M79609 Pain in unspecified limb: Secondary | ICD-10-CM

## 2014-05-05 DIAGNOSIS — M79672 Pain in left foot: Secondary | ICD-10-CM | POA: Insufficient documentation

## 2014-05-05 DIAGNOSIS — N529 Male erectile dysfunction, unspecified: Secondary | ICD-10-CM

## 2014-05-05 DIAGNOSIS — F17200 Nicotine dependence, unspecified, uncomplicated: Secondary | ICD-10-CM

## 2014-05-05 MED ORDER — SILDENAFIL CITRATE 100 MG PO TABS
50.0000 mg | ORAL_TABLET | Freq: Every day | ORAL | Status: AC | PRN
Start: 2014-05-05 — End: ?

## 2014-05-05 MED ORDER — MELOXICAM 7.5 MG PO TABS: 7.5000 mg | ORAL_TABLET | Freq: Every day | ORAL | Status: AC

## 2014-05-05 MED ORDER — BUPROPION HCL ER (SR) 150 MG PO TB12: ORAL_TABLET | ORAL | Status: AC

## 2014-05-05 NOTE — Progress Notes (Signed)
Patient presents to clinic today with multiple concerns.  Patient endorses continued foot pain and tenderness, worse with ambulation.  Patient seen 1.5 weeks ago and diagnosed with ankle sprain.  X-ray of ankle negative for fracture.  Patient states pain has also been located in the forefoot.  Denies decreased ROM.  Denies swelling or ecchymosis.  Body mass index is 38.81 kg/(m^2).  Patient also wishes to discuss medication for smoking cessation.  Patient has a 15 pack-year smoking history. Has tried e-cigarettes, Nicorette, Nicoderm and other measures without success.  Was given previous prescription for Chantix by PCP, but never had it filled.  Patient has history of anxiety and vivid dreams.  Is worried about use of medication like Chantix.  Patient also complains of ED with gradual onset, first noticed 6 months ago and worsening.  Endorses difficulty achieving and maintaining an erection.  Denies performance anxiety.  Denies hx of HTN , HLD or DM.  Is overdue for CPE.  Patient denies history of contracture.  Denies abnormal curvature or pain with erection.  Denies dysuria, urgency, frequency, hesitancy or nocturia.   Past Medical History  Diagnosis Date  . Psoriasis     No current outpatient prescriptions on file prior to visit.   No current facility-administered medications on file prior to visit.    No Known Allergies  Family History  Problem Relation Age of Onset  . Diabetes Father   . Heart attack Maternal Grandfather   . Heart disease Maternal Grandfather 38    MI    History   Social History  . Marital Status: Married    Spouse Name: N/A    Number of Children: N/A  . Years of Education: N/A   Occupational History  . [employer]     Social History Main Topics  . Smoking status: Current Every Day Smoker -- 1.50 packs/day for 4 years    Types: Cigarettes  . Smokeless tobacco: Never Used  . Alcohol Use: 6.0 oz/week    10 Cans of beer per week  . Drug Use: No   . Sexual Activity: None   Other Topics Concern  . None   Social History Narrative  . None   Review of Systems - See HPI.  All other ROS are negative.  BP 131/84  Pulse 108  Temp(Src) 98.7 F (37.1 C) (Oral)  Resp 18  Ht 5\' 10"  (1.778 m)  Wt 270 lb 8 oz (122.698 kg)  BMI 38.81 kg/m2  SpO2 99%  Physical Exam  Vitals reviewed. Constitutional: He is oriented to person, place, and time and well-developed, well-nourished, and in no distress.  HENT:  Head: Normocephalic and atraumatic.  Eyes: Conjunctivae are normal.  Neck: Neck supple.  Cardiovascular: Normal rate, regular rhythm, normal heart sounds and intact distal pulses.   Pulmonary/Chest: Effort normal and breath sounds normal. No respiratory distress. He has no wheezes. He has no rales. He exhibits no tenderness.  Genitourinary: Testes/scrotum normal and penis normal.  Musculoskeletal:       Left ankle: He exhibits normal range of motion, no swelling, no ecchymosis, no deformity and normal pulse. Tenderness. AITFL tenderness found. Achilles tendon normal.       Left foot: He exhibits tenderness. He exhibits normal range of motion, no bony tenderness, normal capillary refill and no crepitus.  Neurological: He is alert and oriented to person, place, and time.  Skin: Skin is warm and dry. No rash noted.  Psychiatric: Affect normal.   Assessment/Plan: Left foot pain  Suspect continued healing ankle sprain, but due to persistence of symptoms will obtain x-ray of left foot.  Giving patient's body habitus, also a possibility of stress fracture. Reiterated importance of NSAIDS, elevation and supportive footwear. Will also refer to Orthopedics.  Tobacco abuse Discussed Rx options.  Will hold off on Chantix due to hx of anxiety and patient having vivid dreams already.  Will attempt trial of Wellbutrin SR.  Dosing instructions given to patient.  ADRs of medication discussed with patient.  Follow-up in 1 month with Dr. Laury AxonLowne.  Return  sooner if needed.  Erectile dysfunction Unclear etiology.  No symptoms of BPH as cause. No history of DM of HLD. Physical examination unremarkable.  Patient has upcoming appointment with PCP for CPE.  Will defer lab testing to that appointment.  Will attempt trial of Viagra 50 mg to take as directed.  Discussed ADRs of medications with patient.  Follow-up with PCP in 1 month.

## 2014-05-05 NOTE — Assessment & Plan Note (Signed)
Unclear etiology.  No symptoms of BPH as cause. No history of DM of HLD. Physical examination unremarkable.  Patient has upcoming appointment with PCP for CPE.  Will defer lab testing to that appointment.  Will attempt trial of Viagra 50 mg to take as directed.  Discussed ADRs of medications with patient.  Follow-up with PCP in 1 month.

## 2014-05-05 NOTE — Assessment & Plan Note (Signed)
Discussed Rx options.  Will hold off on Chantix due to hx of anxiety and patient having vivid dreams already.  Will attempt trial of Wellbutrin SR.  Dosing instructions given to patient.  ADRs of medication discussed with patient.  Follow-up in 1 month with Dr. Laury AxonLowne.  Return sooner if needed.

## 2014-05-05 NOTE — Assessment & Plan Note (Signed)
Suspect continued healing ankle sprain, but due to persistence of symptoms will obtain x-ray of left foot.  Giving patient's body habitus, also a possibility of stress fracture. Reiterated importance of NSAIDS, elevation and supportive footwear. Will also refer to Orthopedics.

## 2014-05-05 NOTE — Progress Notes (Signed)
Pre visit review using our clinic review tool, if applicable. No additional management support is needed unless otherwise documented below in the visit note/SLS  

## 2014-05-05 NOTE — Patient Instructions (Signed)
For Smoking Cessation -- please take Wellbutrin as directed.  Read information below.  Follow-up with Dr. Laury AxonLowne in 1 month.  If you notice any worsening anxiety or mood on this medication, please call the office.  For Foot/Ankle pain -- take Mobic daily as directed with food.  Avoid overexertion. Elevate foot while at home.  Wear supportive shoes.  [facility] [address] [facility] [address]  Radiology is on the 1st floor.  I will call you with your results.  You will be contacted by Orthopedics for further evaluation.  For ED -- take 1/2 tablet of Viagra 30 minutes prior to desired effect.  Common side effects include headache or lightheadedness. Follow-up with Dr. Laury AxonLowne for a Complete Physical so that causes of ED can be ruled out.

## 2014-05-06 ENCOUNTER — Ambulatory Visit (HOSPITAL_BASED_OUTPATIENT_CLINIC_OR_DEPARTMENT_OTHER)
Admission: RE | Admit: 2014-05-06 | Discharge: 2014-05-06 | Disposition: A | Discharge status: Home / Self Care | Payer: BC Managed Care – PPO | Source: Ambulatory Visit | Attending: Physician Assistant | Admitting: Physician Assistant

## 2014-05-06 DIAGNOSIS — M79672 Pain in left foot: Secondary | ICD-10-CM

## 2014-05-06 DIAGNOSIS — M79609 Pain in unspecified limb: Secondary | ICD-10-CM | POA: Insufficient documentation

## 2014-05-07 ENCOUNTER — Telehealth: Payer: Self-pay | Admitting: Family Medicine

## 2014-05-07 NOTE — Telephone Encounter (Signed)
Notes Recorded by Piedad ClimesWilliam Cody Martin, PA-C on 05/06/2014 at 5:13 PM No evidence of fracture. I have set him up with orthopedics due to chronic pain of foot. He will be contacted by them for further evaluation   Patient has been made aware and voiced understanding.     KP

## 2014-05-07 NOTE — Telephone Encounter (Signed)
Caller name:[place] Relation to ZO:XWRUpt:self Call back number:[phone] Pharmacy:  Reason for call: pt would like X-ray results

## 2014-06-20 ENCOUNTER — Encounter: Payer: BC Managed Care – PPO | Admitting: Family Medicine

## 2014-06-20 ENCOUNTER — Telehealth: Payer: Self-pay | Admitting: *Deleted

## 2014-06-20 DIAGNOSIS — Z0289 Encounter for other administrative examinations: Secondary | ICD-10-CM

## 2014-06-20 NOTE — Telephone Encounter (Signed)
Pt did not show for appointment 06/20/2014 at 9:30am for CPE.

## 2014-06-20 NOTE — Telephone Encounter (Signed)
Ok to charge if he did not call

## 2015-06-07 IMAGING — CR DG FOOT COMPLETE 3+V*L*
3 series · 3 of 3 positions shown · non-contrast
Comparison: None

CLINICAL DATA: Foot pain

EXAM:
LEFT FOOT - COMPLETE 3+ VIEW

[t foot ap left]
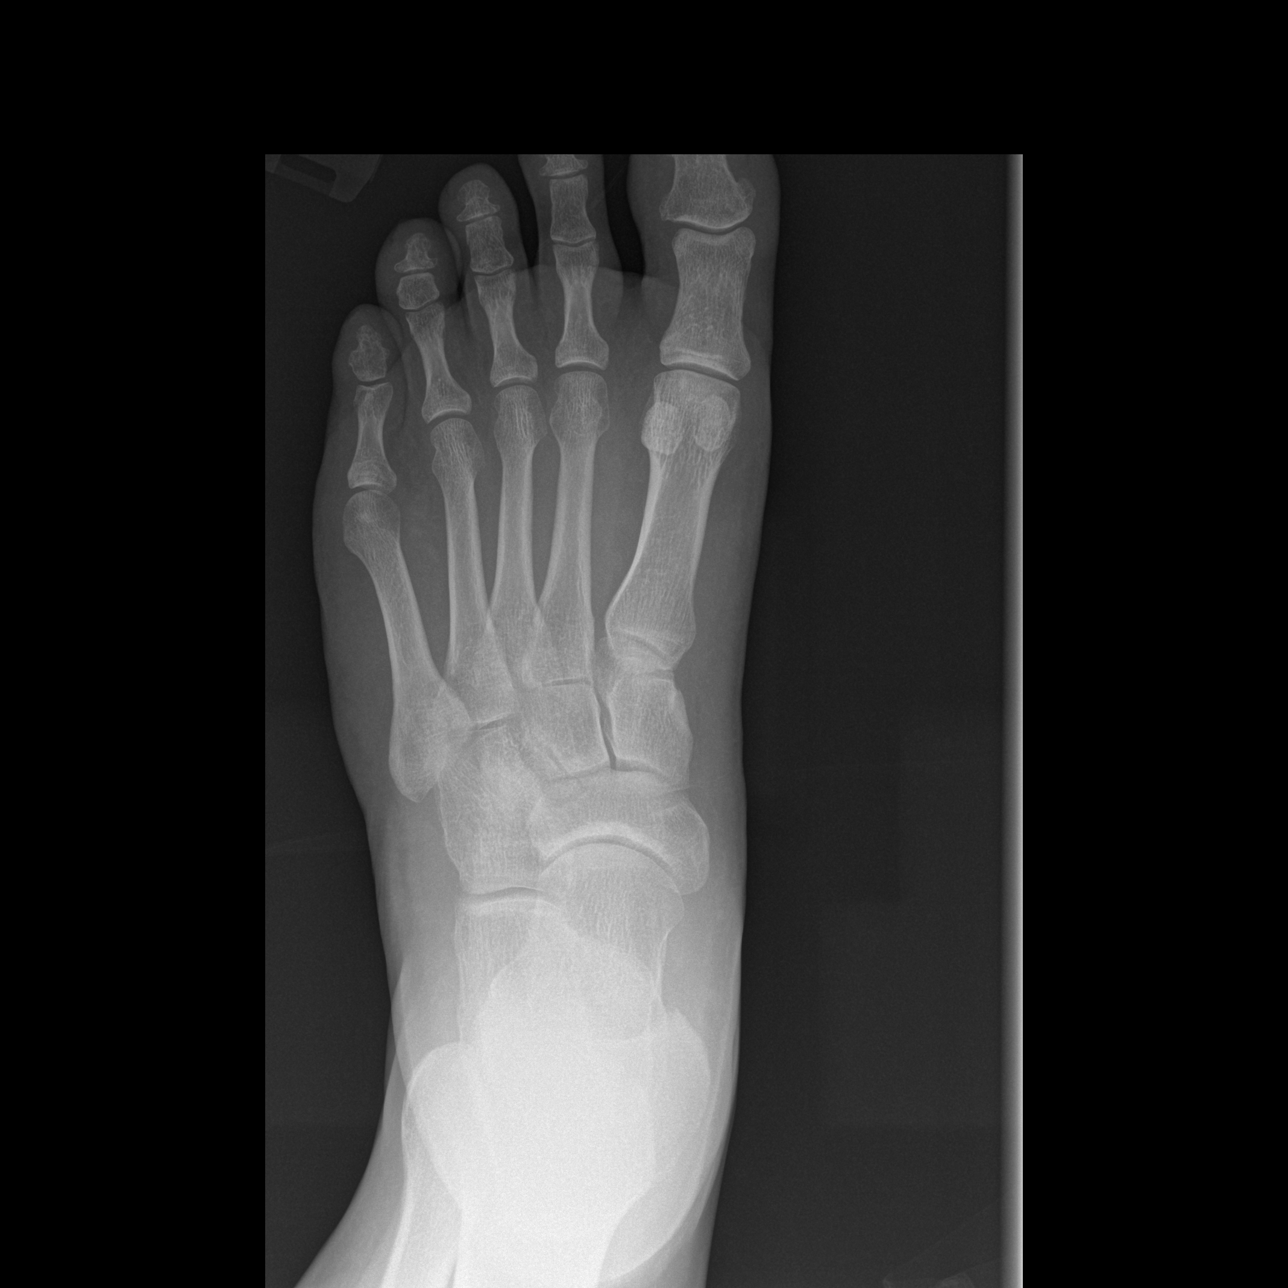

[t foot oblique left]
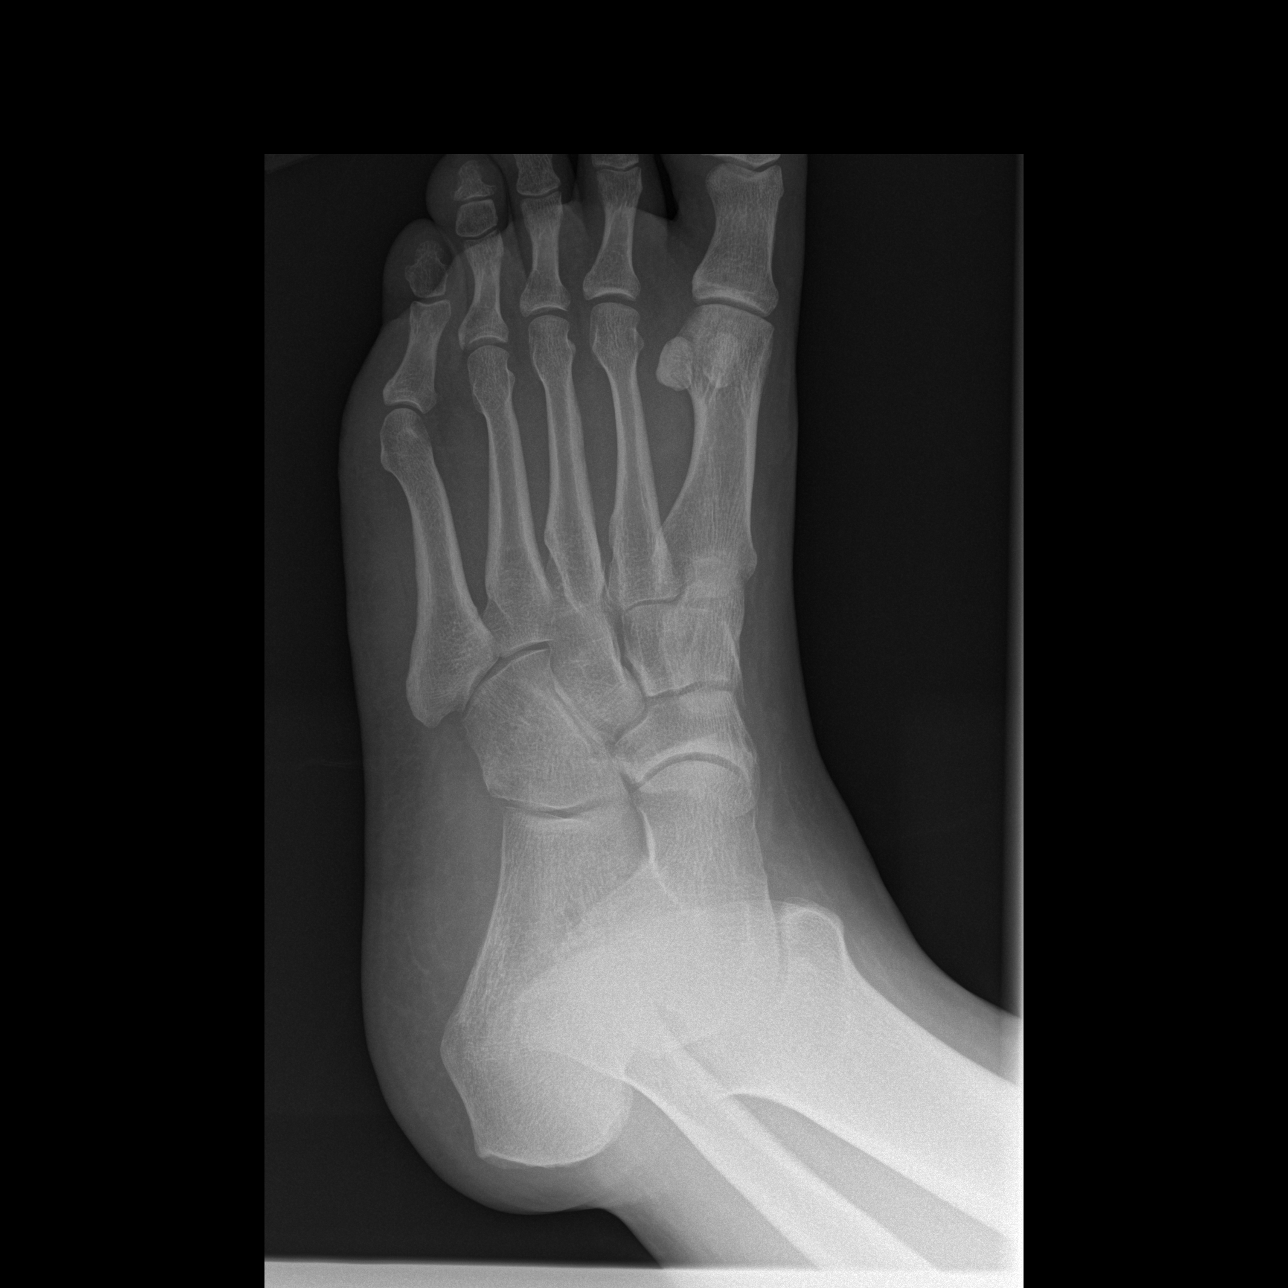

[t foot lat left]
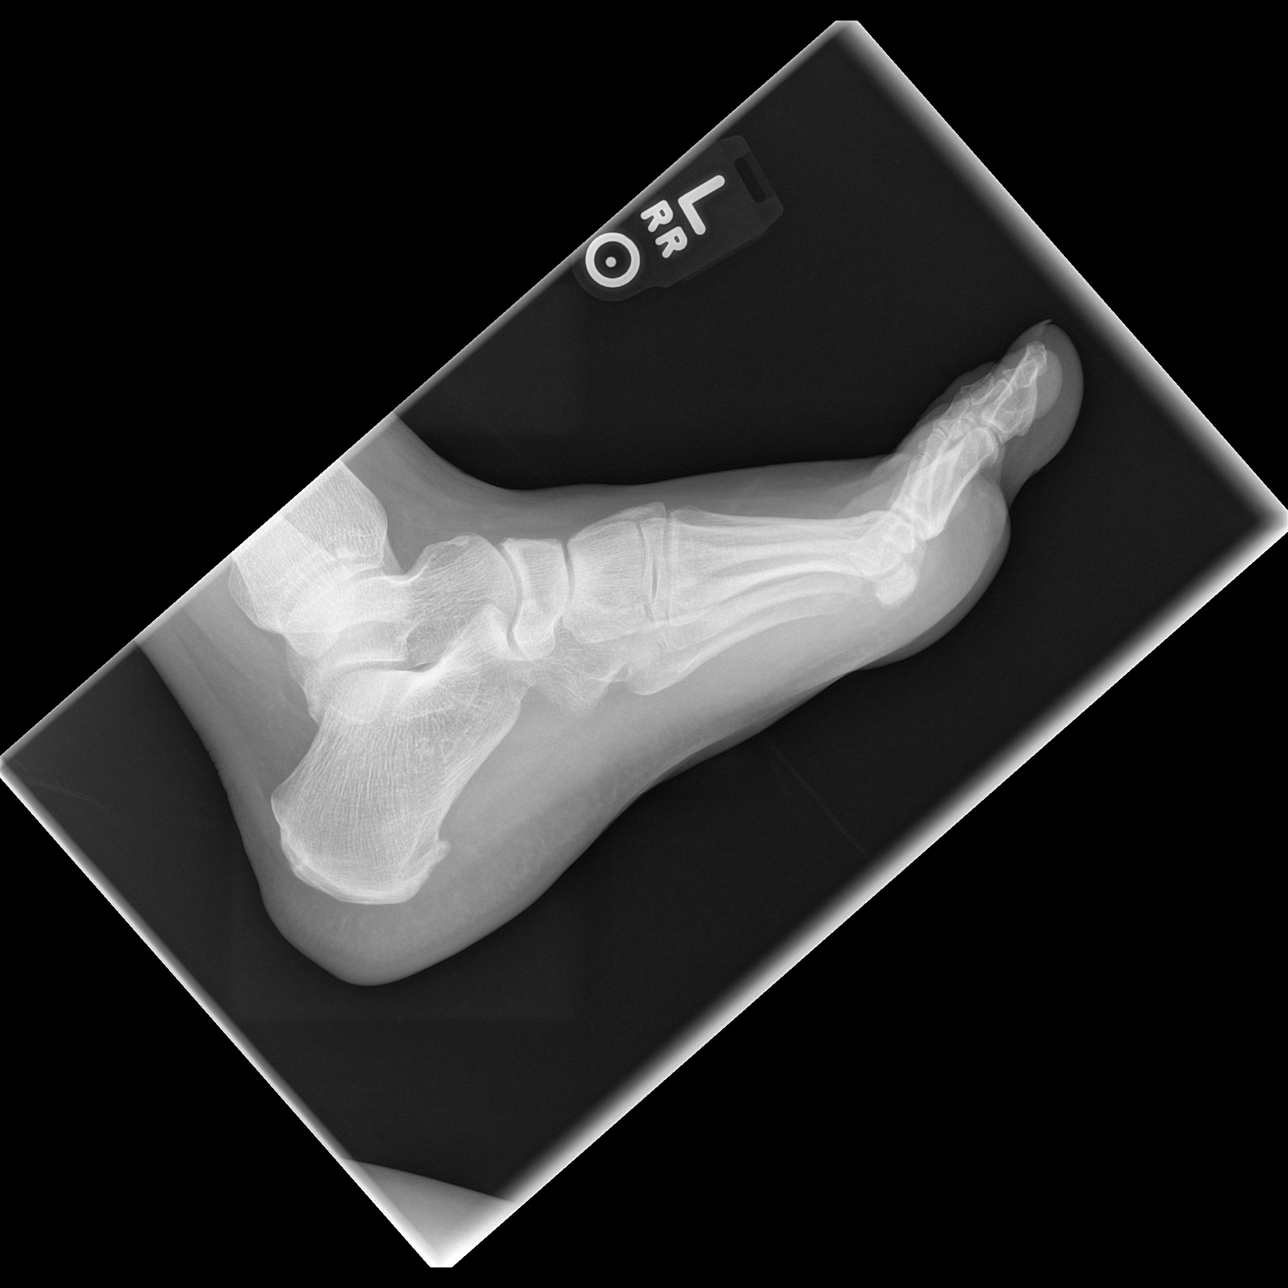

[3 of 3 positions shown; findings below may reference images not displayed]

FINDINGS: There is no evidence of fracture or dislocation. There is no
evidence of arthropathy or other focal bone abnormality. Soft
tissues are unremarkable.
IMPRESSION: Negative.
